# Patient Record
Sex: Female | Born: 1955 | Race: White | Marital: Married | Smoking: Former smoker
Health system: Southern US, Community
[De-identification: ages and names within clinical notes are randomized; demographics above are authoritative.]

## PROBLEM LIST (undated history)

## (undated) DIAGNOSIS — E559 Vitamin D deficiency, unspecified: Secondary | ICD-10-CM

## (undated) DIAGNOSIS — I10 Essential (primary) hypertension: Secondary | ICD-10-CM

## (undated) DIAGNOSIS — M1990 Unspecified osteoarthritis, unspecified site: Secondary | ICD-10-CM

## (undated) DIAGNOSIS — E785 Hyperlipidemia, unspecified: Secondary | ICD-10-CM

## (undated) DIAGNOSIS — K219 Gastro-esophageal reflux disease without esophagitis: Secondary | ICD-10-CM

## (undated) DIAGNOSIS — E039 Hypothyroidism, unspecified: Secondary | ICD-10-CM

## (undated) HISTORY — PX: HERNIA REPAIR: SHX51

## (undated) HISTORY — DX: Unspecified osteoarthritis, unspecified site: M19.90

## (undated) HISTORY — PX: CERVICAL FUSION: SHX112

## (undated) HISTORY — DX: Hyperlipidemia, unspecified: E78.5

## (undated) HISTORY — PX: HEEL SPUR SURGERY: SHX665

## (undated) HISTORY — DX: Vitamin D deficiency, unspecified: E55.9

## (undated) HISTORY — PX: CHOLECYSTECTOMY: SHX55

## (undated) HISTORY — PX: ABDOMINAL HYSTERECTOMY: SHX81

## (undated) HISTORY — PX: TONSILLECTOMY: SUR1361

---

## 2010-10-31 ENCOUNTER — Other Ambulatory Visit: Payer: Self-pay | Admitting: Neurosurgery

## 2010-10-31 DIAGNOSIS — M4712 Other spondylosis with myelopathy, cervical region: Secondary | ICD-10-CM

## 2010-11-01 ENCOUNTER — Ambulatory Visit
Admission: RE | Admit: 2010-11-01 | Discharge: 2010-11-01 | Disposition: A | Discharge status: Home / Self Care | Payer: BC Managed Care – PPO | Source: Ambulatory Visit | Attending: Neurosurgery | Admitting: Neurosurgery

## 2010-11-01 DIAGNOSIS — M4712 Other spondylosis with myelopathy, cervical region: Secondary | ICD-10-CM

## 2022-09-26 DIAGNOSIS — H04123 Dry eye syndrome of bilateral lacrimal glands: Secondary | ICD-10-CM | POA: Diagnosis not present

## 2022-09-26 DIAGNOSIS — H2513 Age-related nuclear cataract, bilateral: Secondary | ICD-10-CM | POA: Diagnosis not present

## 2022-10-24 NOTE — H&P (Signed)
Surgical History & Physical  Patient Name: Diane Phillips  DOB: [birth year]  Surgery: Cataract extraction with intraocular lens implant phacoemulsification; Right Eye Surgeon: Ronn Melena MD Surgery Date: 11/03/2022 Pre-Op Date: 09/26/2022  HPI: A 72 Yr. old female patient presents for a cataract evaluation. Patient reports that she has difficulty with small print making everyday tasks more difficult. Patient states that her vision is blurred and hazy. Patient states that she is unable to recognize people from [place]. Patient states that she has very poor night vision and is bothered by the glare she has from headlights. Patient states that she also receives glare from the sunlight making it more difficult to see street and traffic signs.   Medical History: Heart Problem  Review of Systems Negative Allergic/Immunologic Negative Constitutional Negative Ear, Nose, Mouth & Throat Negative Endocrine Negative Eyes Negative Gastrointestinal Negative Genitourinary Negative Hematologic/Lymphatic Negative Integumentary Negative Musculoskeletal Negative Neurological Negative Psychiatry Negative Respiratory Cardiovascular High Blood Pressure All recorded systems are negative except as noted above.  Social Never smoked   Medication Systane Complete,  SIMVASTATIN, LEVOTHYROXINE SODIUM, METOPROLOL TARTRATE, TRIAMTERENE/HYDROCHLOROTHIAZIDE, SOLIFENACIN SUCCINATE, TRAZODONE HYDROCHLORIDE, PANTOPRAZOLE SODIUM, BENZONATATE, LORATADINE, NAPROXEN, NYSTATIN  Sx/Procedures None  Drug Allergies NKDA  History & Physical: Heent: cataracts NECK: supple without bruits LUNGS: lungs clear to auscultation CV: regular rate and rhythm Abdomen: soft and non-tender  Impression & Plan: Assessment: 1.  Myopia ; Both Eyes (H52.13) 2.  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  CATARACT AGE-RELATED NUCLEAR; Both Eyes (H25.13)  Plan: 1.  continue with current for now  2.  Discussed ATs QID, and  warm compresses PRN  3.  Cataracts are visually significant and account for the patient's complaints. Discussed all risks, benefits, procedures and recovery, including infection, loss of vision and eye, need for glasses after surgery or additional procedures. Patient understands changing glasses will not improve vision. Patient indicated understanding of procedure. All questions answered. Patient desires to have surgery, recommend phacoemulsification with intraocular lens. Patient to have preliminary testing necessary (Argos/IOL Master, Mac OCT, TOPO) Educational materials provided:Cataract. RECOMMEND STANDARD VS EYEHANCE VS TORIC VS VIVTY Plan: - Proceed with surgery OD and then OS - RayOne Lens with -0.25 target - ok with lying flat, no fuchs, no DM - Good dilation - Omidria and combo drop

## 2022-10-27 ENCOUNTER — Encounter (HOSPITAL_COMMUNITY): Payer: Self-pay

## 2022-10-27 ENCOUNTER — Encounter (HOSPITAL_COMMUNITY)
Admission: RE | Admit: 2022-10-27 | Discharge: 2022-10-27 | Disposition: A | Discharge status: Home / Self Care | Payer: Self-pay | Source: Ambulatory Visit | Attending: Optometry | Admitting: Optometry

## 2022-10-27 ENCOUNTER — Other Ambulatory Visit: Payer: Self-pay

## 2022-10-27 DIAGNOSIS — H2511 Age-related nuclear cataract, right eye: Secondary | ICD-10-CM | POA: Diagnosis not present

## 2022-10-27 HISTORY — DX: Essential (primary) hypertension: I10

## 2022-10-27 HISTORY — DX: Hypothyroidism, unspecified: E03.9

## 2022-10-27 HISTORY — DX: Gastro-esophageal reflux disease without esophagitis: K21.9

## 2022-11-03 ENCOUNTER — Ambulatory Visit (HOSPITAL_COMMUNITY): Payer: Medicare Other | Admitting: Anesthesiology

## 2022-11-03 ENCOUNTER — Encounter (HOSPITAL_COMMUNITY)
Admission: RE | Disposition: A | Discharge status: Home / Self Care | Payer: Self-pay | Source: Home / Self Care | Attending: Optometry

## 2022-11-03 ENCOUNTER — Encounter (HOSPITAL_COMMUNITY): Payer: Self-pay | Admitting: Optometry

## 2022-11-03 ENCOUNTER — Ambulatory Visit (HOSPITAL_BASED_OUTPATIENT_CLINIC_OR_DEPARTMENT_OTHER): Payer: Medicare Other | Admitting: Anesthesiology

## 2022-11-03 ENCOUNTER — Ambulatory Visit (HOSPITAL_COMMUNITY)
Admission: RE | Admit: 2022-11-03 | Discharge: 2022-11-03 | Disposition: A | Discharge status: Home / Self Care | Payer: Medicare Other | Attending: Optometry | Admitting: Optometry

## 2022-11-03 DIAGNOSIS — Z6841 Body Mass Index (BMI) 40.0 and over, adult: Secondary | ICD-10-CM | POA: Insufficient documentation

## 2022-11-03 DIAGNOSIS — H2511 Age-related nuclear cataract, right eye: Secondary | ICD-10-CM | POA: Insufficient documentation

## 2022-11-03 DIAGNOSIS — E039 Hypothyroidism, unspecified: Secondary | ICD-10-CM

## 2022-11-03 DIAGNOSIS — K219 Gastro-esophageal reflux disease without esophagitis: Secondary | ICD-10-CM | POA: Diagnosis not present

## 2022-11-03 DIAGNOSIS — H25811 Combined forms of age-related cataract, right eye: Secondary | ICD-10-CM

## 2022-11-03 DIAGNOSIS — Z87891 Personal history of nicotine dependence: Secondary | ICD-10-CM | POA: Diagnosis not present

## 2022-11-03 DIAGNOSIS — I10 Essential (primary) hypertension: Secondary | ICD-10-CM | POA: Insufficient documentation

## 2022-11-03 DIAGNOSIS — H5213 Myopia, bilateral: Secondary | ICD-10-CM | POA: Diagnosis not present

## 2022-11-03 DIAGNOSIS — H04123 Dry eye syndrome of bilateral lacrimal glands: Secondary | ICD-10-CM | POA: Insufficient documentation

## 2022-11-03 HISTORY — PX: CATARACT EXTRACTION W/PHACO: SHX586

## 2022-11-03 SURGERY — PHACOEMULSIFICATION, CATARACT, WITH IOL INSERTION
Anesthesia: Monitor Anesthesia Care | Site: Eye | Laterality: Right

## 2022-11-03 MED ORDER — TROPICAMIDE 1 % OP SOLN
1.0000 [drp] | OPHTHALMIC | Status: AC
  Administered 2022-11-03 (×3): 1 [drp] via OPHTHALMIC

## 2022-11-03 MED ORDER — PHENYLEPHRINE-KETOROLAC 1-0.3 % IO SOLN
INTRAOCULAR | Status: DC | PRN
  Administered 2022-11-03: 500 mL via OPHTHALMIC

## 2022-11-03 MED ORDER — POVIDONE-IODINE 5 % OP SOLN
OPHTHALMIC | Status: DC | PRN
  Administered 2022-11-03: 1 via OPHTHALMIC

## 2022-11-03 MED ORDER — MIDAZOLAM HCL 5 MG/5ML IJ SOLN
INTRAMUSCULAR | Status: DC | PRN
  Administered 2022-11-03: 1 mg via INTRAVENOUS

## 2022-11-03 MED ORDER — TETRACAINE HCL 0.5 % OP SOLN
1.0000 [drp] | OPHTHALMIC | Status: AC
  Administered 2022-11-03 (×3): 1 [drp] via OPHTHALMIC

## 2022-11-03 MED ORDER — NEOMYCIN-POLYMYXIN-DEXAMETH 3.5-10000-0.1 OP SUSP
OPHTHALMIC | Status: DC | PRN
  Administered 2022-11-03: 1 [drp] via OPHTHALMIC

## 2022-11-03 MED ORDER — MIDAZOLAM HCL 2 MG/2ML IJ SOLN
INTRAMUSCULAR | Status: AC
  Filled 2022-11-03: qty 2

## 2022-11-03 MED ORDER — STERILE WATER FOR IRRIGATION IR SOLN
Status: DC | PRN
  Administered 2022-11-03: 250 mL

## 2022-11-03 MED ORDER — LIDOCAINE HCL (PF) 1 % IJ SOLN
INTRAMUSCULAR | Status: DC | PRN
  Administered 2022-11-03: 1 mL

## 2022-11-03 MED ORDER — FENTANYL CITRATE (PF) 100 MCG/2ML IJ SOLN
INTRAMUSCULAR | Status: DC | PRN
  Administered 2022-11-03: 50 ug via INTRAVENOUS

## 2022-11-03 MED ORDER — BSS IO SOLN
INTRAOCULAR | Status: DC | PRN
  Administered 2022-11-03: 15 mL via INTRAOCULAR

## 2022-11-03 MED ORDER — PHENYLEPHRINE HCL 2.5 % OP SOLN
1.0000 [drp] | OPHTHALMIC | Status: AC
  Administered 2022-11-03 (×3): 1 [drp] via OPHTHALMIC

## 2022-11-03 MED ORDER — SODIUM CHLORIDE 0.9% FLUSH
INTRAVENOUS | Status: DC | PRN
  Administered 2022-11-03: 10 mL via INTRAVENOUS

## 2022-11-03 MED ORDER — FENTANYL CITRATE (PF) 100 MCG/2ML IJ SOLN
INTRAMUSCULAR | Status: AC
  Filled 2022-11-03: qty 2

## 2022-11-03 MED ORDER — SIGHTPATH DOSE#1 NA HYALUR & NA CHOND-NA HYALUR IO KIT
PACK | INTRAOCULAR | Status: DC | PRN
  Administered 2022-11-03: 1 via OPHTHALMIC

## 2022-11-03 MED ORDER — LIDOCAINE HCL 3.5 % OP GEL
1.0000 | Freq: Once | OPHTHALMIC | Status: AC
  Administered 2022-11-03: 1 via OPHTHALMIC

## 2022-11-03 SURGICAL SUPPLY — 16 items
CATARACT SUITE SIGHTPATH (MISCELLANEOUS) ×1 IMPLANT
CLOTH BEACON ORANGE TIMEOUT ST (SAFETY) ×1 IMPLANT
DRSG TEGADERM 4X4.75 (GAUZE/BANDAGES/DRESSINGS) ×1 IMPLANT
EYE SHIELD UNIVERSAL CLEAR (GAUZE/BANDAGES/DRESSINGS) IMPLANT
FEE CATARACT SUITE SIGHTPATH (MISCELLANEOUS) ×1 IMPLANT
GLOVE BIOGEL PI IND STRL 7.0 (GLOVE) ×2 IMPLANT
LENS IOL RAYNER 17.5 (Intraocular Lens) ×1 IMPLANT
LENS IOL RAYONE EMV 17.5 (Intraocular Lens) IMPLANT
NDL HYPO 18GX1.5 BLUNT FILL (NEEDLE) ×1 IMPLANT
NEEDLE HYPO 18GX1.5 BLUNT FILL (NEEDLE) ×1 IMPLANT
PAD ARMBOARD 7.5X6 YLW CONV (MISCELLANEOUS) ×1 IMPLANT
RING MALYGIN 7.0 (MISCELLANEOUS) IMPLANT
SYR TB 1ML LL NO SAFETY (SYRINGE) ×1 IMPLANT
TAPE SURG TRANSPORE 1 IN (GAUZE/BANDAGES/DRESSINGS) IMPLANT
TAPE SURGICAL TRANSPORE 1 IN (GAUZE/BANDAGES/DRESSINGS) ×1
WATER STERILE IRR 250ML POUR (IV SOLUTION) ×1 IMPLANT

## 2022-11-03 NOTE — Interval H&P Note (Signed)
History and Physical Interval Note:  11/03/2022 8:53 AM  The H and P was reviewed and updated. The patient was examined.  No changes were found after exam.  The surgical eye was marked.  Diane Phillips

## 2022-11-03 NOTE — Anesthesia Postprocedure Evaluation (Signed)
Anesthesia Post Note  Patient: Diane Phillips  Procedure(s) Performed: CATARACT EXTRACTION PHACO AND INTRAOCULAR LENS PLACEMENT (IOC) (Right: Eye)  Patient location during evaluation: Phase II Anesthesia Type: MAC Level of consciousness: awake and alert and oriented Pain management: pain level controlled Vital Signs Assessment: post-procedure vital signs reviewed and stable Respiratory status: spontaneous breathing, nonlabored ventilation and respiratory function stable Cardiovascular status: stable and blood pressure returned to baseline Postop Assessment: no apparent nausea or vomiting Anesthetic complications: no  No notable events documented.   Last Vitals:  Vitals:   11/03/22 0815 11/03/22 0935  BP: (!) 163/89 (!) 156/86  Pulse: (!) 52 (!) 59  Resp: 14 14  Temp: 36.7 C 36.7 C  SpO2: 96% 96%    Last Pain:  Vitals:   11/03/22 0935  TempSrc: Oral  PainSc: 0-No pain                 Latavious Bitter C Rin Gorton

## 2022-11-03 NOTE — Op Note (Addendum)
Date of procedure: 11/03/22  Pre-operative diagnosis: Visually significant age-related nuclear cataract, Right Eye (H25.11)  Post-operative diagnosis: Visually significant age-related nuclear cataract, Right Eye  Procedure: Removal of cataract via phacoemulsification and insertion of intra-ocular lens Rayner [id number] +17.5D into the capsular bag of the Right Eye  Attending surgeon: Ronn Melena, MD  Anesthesia: MAC, Topical Akten  Complications: None  Estimated Blood Loss: <38m (minimal)  Specimens: None  Implants:  Implant Name Type Inv. Item Serial No. Manufacturer Lot No. LRB No. Used Action  LENS IOL RAYNER 17.5 - S18 Intraocular Lens LENS IOL RAYNER 17.5 18 SIGHTPATH [id number] Implanted    Indications:  Visually significant age-related cataract, Right Eye  Procedure:  The patient was seen and identified in the pre-operative area. The operative eye was identified and dilated.  The operative eye was marked.  Topical anesthesia was administered to the operative eye.     The patient was then to the operative suite and placed in the supine position.  A timeout was performed confirming the patient, procedure to be performed, and all other relevant information.   The patient's face was prepped and draped in the usual fashion for intra-ocular surgery.  A lid speculum was placed into the operative eye and the surgical microscope moved into place and focused.  A superotemporal paracentesis was created using a 20 gauge paracentesis blade.  BSS mixed with Omidria, followed by 1% lidocaine was injected into the anterior chamber.  Viscoelastic was injected into the anterior chamber.  A temporal clear-corneal main wound incision was created using a 2.458mmicrokeratome.  A continuous curvilinear capsulorrhexis was initiated using an irrigating cystitome and completed using capsulorrhexis forceps.  Hydrodissection and hydrodeliniation were performed.  Viscoelastic was injected into the  anterior chamber.  A phacoemulsification handpiece and a chopper as a second instrument were used to remove the nucleus and epinucleus. The irrigation/aspiration handpiece was used to remove any remaining cortical material.   The capsular bag was reinflated with viscoelastic, checked, and found to be intact.  The intraocular lens was inserted into the capsular bag.  The irrigation/aspiration handpiece was used to remove any remaining viscoelastic.  The clear corneal wound and paracentesis wounds were then hydrated and checked with Weck-Cels to be watertight.  The lid-speculum and drape was removed, and the patient's face was cleaned with a wet and dry 4x4.  One drop of maxitrol was placed on the ocular surface.  A clear shield was taped over the eye. The patient was taken to the post-operative care unit in good condition, having tolerated the procedure well.  Post-Op Instructions: [facility] and will receive all other orders and instructions.

## 2022-11-03 NOTE — Discharge Instructions (Signed)
Please discharge patient when stable, will follow up today with Dr. Masen Salvas at the [facility] [place] office immediately following discharge.  Leave shield in place until visit.  All paperwork with discharge instructions will be given at the office.  [facility] Address:  [address]  [place], [place] [address]  Dr. Odessa Morren's Phone: [phone]  

## 2022-11-03 NOTE — Anesthesia Preprocedure Evaluation (Signed)
Anesthesia Evaluation  Patient identified by MRN, date of birth, ID band Patient awake    Reviewed: Allergy & Precautions, H&P , NPO status , Patient's Chart, lab work & pertinent test results, reviewed documented beta blocker date and time   Airway Mallampati: III  TM Distance: >3 FB Neck ROM: Full    Dental  (+) Dental Advisory Given, Upper Dentures, Partial Lower   Pulmonary shortness of breath and with exertion, former smoker   Pulmonary exam normal breath sounds clear to auscultation       Cardiovascular hypertension, Pt. on medications and Pt. on home beta blockers Normal cardiovascular exam Rhythm:Regular Rate:Normal     Neuro/Psych negative neurological ROS  negative psych ROS   GI/Hepatic Neg liver ROS,GERD  Medicated and Controlled,,  Endo/Other  Hypothyroidism  Morbid obesity  Renal/GU negative Renal ROS  negative genitourinary   Musculoskeletal negative musculoskeletal ROS (+)    Abdominal   Peds negative pediatric ROS (+)  Hematology negative hematology ROS (+)   Anesthesia Other Findings   Reproductive/Obstetrics negative OB ROS                             Anesthesia Physical Anesthesia Plan  ASA: 3  Anesthesia Plan: MAC   Post-op Pain Management: Minimal or no pain anticipated   Induction: Intravenous  PONV Risk Score and Plan: Treatment may vary due to age or medical condition  Airway Management Planned: Nasal Cannula and Natural Airway  Additional Equipment:   Intra-op Plan:   Post-operative Plan:   Informed Consent: I have reviewed the patients History and Physical, chart, labs and discussed the procedure including the risks, benefits and alternatives for the proposed anesthesia with the patient or authorized representative who has indicated his/her understanding and acceptance.     Dental advisory given  Plan Discussed with: CRNA and  Surgeon  Anesthesia Plan Comments:        Anesthesia Quick Evaluation

## 2022-11-03 NOTE — Anesthesia Procedure Notes (Signed)
Procedure Name: MAC Date/Time: 11/03/2022 9:21 AM  Performed by: Maude Leriche, CRNAPre-anesthesia Checklist: Patient identified, Emergency Drugs available, Suction available, Patient being monitored and Timeout performed Patient Re-evaluated:Patient Re-evaluated prior to induction Oxygen Delivery Method: Nasal cannula Placement Confirmation: positive ETCO2 Dental Injury: Teeth and Oropharynx as per pre-operative assessment

## 2022-11-03 NOTE — Transfer of Care (Signed)
Immediate Anesthesia Transfer of Care Note  Patient: Diane Phillips  Procedure(s) Performed: CATARACT EXTRACTION PHACO AND INTRAOCULAR LENS PLACEMENT (IOC) (Right: Eye)  Patient Location: PACU  Anesthesia Type:MAC  Level of Consciousness: awake, alert , and oriented  Airway & Oxygen Therapy: Patient Spontanous Breathing  Post-op Assessment: Report given to RN, Post -op Vital signs reviewed and stable, Patient moving all extremities X 4, and Patient able to stick tongue midline  Post vital signs: Reviewed  Last Vitals:  Vitals Value Taken Time  BP 156/68   Temp 97.6   Pulse 58   Resp 19   SpO2 96     Last Pain:  Vitals:   11/03/22 0815  TempSrc: Oral         Complications: No notable events documented.

## 2022-11-10 ENCOUNTER — Encounter (HOSPITAL_COMMUNITY): Payer: Self-pay | Admitting: Optometry

## 2022-11-21 NOTE — H&P (Signed)
Surgical History & Physical  Patient Name: Diane Phillips  DOB: 08-27-1956  Surgery: Cataract extraction with intraocular lens implant phacoemulsification; Left Eye Surgeon: Ronn Melena MD Surgery Date: 12/01/2022 Pre-Op Date: 09/26/2022  HPI: A 4 Yr. old female patient presents for a cataract evaluation. Patient reports that she has difficulty with small print making everyday tasks more difficult. Patient states that her vision is blurred and hazy. Patient states that she is unable to recognize people from [place]. Patient states that she has very poor night vision and is bothered by the glare she has from headlights. Patient states that she also receives glare from the sunlight making it more difficult to see street and traffic signs.   Medical History: Heart Problem  Cardiovascular High Blood Pressure All recorded systems are negative except as noted above.  Social Never smoked  Medication Systane Complete, Prednisolone-Moxifloxacin-Bromfenac,  SIMVASTATIN, LEVOTHYROXINE SODIUM, METOPROLOL TARTRATE, TRIAMTERENE/HYDROCHLOROTHIAZIDE, SOLIFENACIN SUCCINATE, TRAZODONE HYDROCHLORIDE, PANTOPRAZOLE SODIUM, BENZONATATE, LORATADINE, NAPROXEN, NYSTATIN  Sx/Procedures Phaco c IOL OD  No Known Drug Allergies   History & Physical: Heent: cataract OS NECK: supple without bruits LUNGS: lungs clear to auscultation CV: regular rate and rhythm Abdomen: soft and non-tender  Impression & Plan: Assessment: 1.  Myopia ; Both Eyes (H52.13) 2.  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  CATARACT AGE-RELATED NUCLEAR; Left Eye (H25.12)  Plan: 1.  continue with current for now  2.  Discussed ATs QID, and warm compresses PRN  3.  Cataracts are visually significant and account for the patient's complaints. Discussed all risks, benefits, procedures and recovery, including infection, loss of vision and eye, need for glasses after surgery or additional procedures. Patient understands changing  glasses will not improve vision. Patient indicated understanding of procedure. All questions answered. Patient desires to have surgery, recommend phacoemulsification with intraocular lens. Patient to have preliminary testing necessary (Argos/IOL Master, Mac OCT, TOPO) Educational materials provided:Cataract. RECOMMEND STANDARD VS EYEHANCE VS TORIC VS VIVTY Plan: - Proceed with surgery OS - RayOne Lens with -0.25 target - ok with lying flat, no fuchs, no DM - Good dilation - Omidria and combo drop

## 2022-11-24 DIAGNOSIS — H2512 Age-related nuclear cataract, left eye: Secondary | ICD-10-CM | POA: Diagnosis not present

## 2022-11-28 ENCOUNTER — Encounter (HOSPITAL_COMMUNITY)
Admission: RE | Admit: 2022-11-28 | Discharge: 2022-11-28 | Disposition: A | Discharge status: Home / Self Care | Payer: Medicare Other | Source: Ambulatory Visit | Attending: Optometry | Admitting: Optometry

## 2022-11-28 ENCOUNTER — Other Ambulatory Visit: Payer: Self-pay

## 2022-11-28 ENCOUNTER — Encounter (HOSPITAL_COMMUNITY): Payer: Self-pay

## 2022-11-28 NOTE — Pre-Procedure Instructions (Signed)
Attempted pre-op phonecall. Left VM for her to call us back. 

## 2022-11-30 DIAGNOSIS — F17210 Nicotine dependence, cigarettes, uncomplicated: Secondary | ICD-10-CM | POA: Diagnosis not present

## 2022-11-30 DIAGNOSIS — R600 Localized edema: Secondary | ICD-10-CM | POA: Diagnosis not present

## 2022-11-30 DIAGNOSIS — E039 Hypothyroidism, unspecified: Secondary | ICD-10-CM | POA: Diagnosis not present

## 2022-11-30 DIAGNOSIS — M5442 Lumbago with sciatica, left side: Secondary | ICD-10-CM | POA: Diagnosis not present

## 2022-11-30 DIAGNOSIS — M5441 Lumbago with sciatica, right side: Secondary | ICD-10-CM | POA: Diagnosis not present

## 2022-11-30 DIAGNOSIS — E782 Mixed hyperlipidemia: Secondary | ICD-10-CM | POA: Diagnosis not present

## 2022-12-01 ENCOUNTER — Ambulatory Visit (HOSPITAL_COMMUNITY): Payer: Medicare Other | Admitting: Anesthesiology

## 2022-12-01 ENCOUNTER — Encounter (HOSPITAL_COMMUNITY): Payer: Self-pay | Admitting: Optometry

## 2022-12-01 ENCOUNTER — Ambulatory Visit (HOSPITAL_BASED_OUTPATIENT_CLINIC_OR_DEPARTMENT_OTHER): Payer: Medicare Other | Admitting: Anesthesiology

## 2022-12-01 ENCOUNTER — Ambulatory Visit (HOSPITAL_COMMUNITY)
Admission: RE | Admit: 2022-12-01 | Discharge: 2022-12-01 | Disposition: A | Discharge status: Home / Self Care | Payer: Medicare Other | Attending: Optometry | Admitting: Optometry

## 2022-12-01 ENCOUNTER — Encounter (HOSPITAL_COMMUNITY)
Admission: RE | Disposition: A | Discharge status: Home / Self Care | Payer: Self-pay | Source: Home / Self Care | Attending: Optometry

## 2022-12-01 DIAGNOSIS — E039 Hypothyroidism, unspecified: Secondary | ICD-10-CM | POA: Diagnosis not present

## 2022-12-01 DIAGNOSIS — Z87891 Personal history of nicotine dependence: Secondary | ICD-10-CM

## 2022-12-01 DIAGNOSIS — I10 Essential (primary) hypertension: Secondary | ICD-10-CM | POA: Diagnosis not present

## 2022-12-01 DIAGNOSIS — H2512 Age-related nuclear cataract, left eye: Secondary | ICD-10-CM | POA: Insufficient documentation

## 2022-12-01 DIAGNOSIS — Z6841 Body Mass Index (BMI) 40.0 and over, adult: Secondary | ICD-10-CM | POA: Diagnosis not present

## 2022-12-01 DIAGNOSIS — K219 Gastro-esophageal reflux disease without esophagitis: Secondary | ICD-10-CM | POA: Diagnosis not present

## 2022-12-01 DIAGNOSIS — H25812 Combined forms of age-related cataract, left eye: Secondary | ICD-10-CM

## 2022-12-01 DIAGNOSIS — Z79899 Other long term (current) drug therapy: Secondary | ICD-10-CM | POA: Diagnosis not present

## 2022-12-01 HISTORY — PX: CATARACT EXTRACTION W/PHACO: SHX586

## 2022-12-01 SURGERY — PHACOEMULSIFICATION, CATARACT, WITH IOL INSERTION
Anesthesia: Monitor Anesthesia Care | Site: Eye | Laterality: Left

## 2022-12-01 MED ORDER — MIDAZOLAM HCL 2 MG/2ML IJ SOLN
INTRAMUSCULAR | Status: AC
  Filled 2022-12-01: qty 2

## 2022-12-01 MED ORDER — STERILE WATER FOR IRRIGATION IR SOLN
Status: DC | PRN
  Administered 2022-12-01: 25 mL

## 2022-12-01 MED ORDER — PHENYLEPHRINE-KETOROLAC 1-0.3 % IO SOLN
INTRAOCULAR | Status: DC | PRN
  Administered 2022-12-01: 500 mL via OPHTHALMIC

## 2022-12-01 MED ORDER — POVIDONE-IODINE 5 % OP SOLN
OPHTHALMIC | Status: DC | PRN
  Administered 2022-12-01: 1 via OPHTHALMIC

## 2022-12-01 MED ORDER — PHENYLEPHRINE-KETOROLAC 1-0.3 % IO SOLN
INTRAOCULAR | Status: AC
  Filled 2022-12-01: qty 4

## 2022-12-01 MED ORDER — LIDOCAINE HCL (PF) 1 % IJ SOLN
INTRAMUSCULAR | Status: DC | PRN
  Administered 2022-12-01: 1 mL

## 2022-12-01 MED ORDER — TROPICAMIDE 1 % OP SOLN
1.0000 [drp] | OPHTHALMIC | Status: AC | PRN
  Administered 2022-12-01 (×3): 1 [drp] via OPHTHALMIC

## 2022-12-01 MED ORDER — BSS IO SOLN
INTRAOCULAR | Status: DC | PRN
  Administered 2022-12-01: 15 mL via INTRAOCULAR

## 2022-12-01 MED ORDER — NEOMYCIN-POLYMYXIN-DEXAMETH 3.5-10000-0.1 OP SUSP
OPHTHALMIC | Status: DC | PRN
  Administered 2022-12-01: 2 [drp] via OPHTHALMIC

## 2022-12-01 MED ORDER — FENTANYL CITRATE (PF) 100 MCG/2ML IJ SOLN
INTRAMUSCULAR | Status: DC | PRN
  Administered 2022-12-01: 50 ug via INTRAVENOUS

## 2022-12-01 MED ORDER — TETRACAINE HCL 0.5 % OP SOLN
1.0000 [drp] | OPHTHALMIC | Status: AC | PRN
  Administered 2022-12-01 (×3): 1 [drp] via OPHTHALMIC

## 2022-12-01 MED ORDER — PHENYLEPHRINE HCL 2.5 % OP SOLN
1.0000 [drp] | OPHTHALMIC | Status: AC | PRN
  Administered 2022-12-01 (×3): 1 [drp] via OPHTHALMIC

## 2022-12-01 MED ORDER — LIDOCAINE HCL 3.5 % OP GEL
1.0000 | Freq: Once | OPHTHALMIC | Status: AC
  Administered 2022-12-01: 1 via OPHTHALMIC

## 2022-12-01 MED ORDER — FENTANYL CITRATE (PF) 100 MCG/2ML IJ SOLN
INTRAMUSCULAR | Status: AC
  Filled 2022-12-01: qty 2

## 2022-12-01 MED ORDER — MIDAZOLAM HCL 2 MG/2ML IJ SOLN
INTRAMUSCULAR | Status: DC | PRN
  Administered 2022-12-01: 1 mg via INTRAVENOUS

## 2022-12-01 MED ORDER — SIGHTPATH DOSE#1 NA HYALUR & NA CHOND-NA HYALUR IO KIT
PACK | INTRAOCULAR | Status: DC | PRN
  Administered 2022-12-01: 1 via OPHTHALMIC

## 2022-12-01 SURGICAL SUPPLY — 14 items
CATARACT SUITE SIGHTPATH (MISCELLANEOUS) ×1 IMPLANT
CLOTH BEACON ORANGE TIMEOUT ST (SAFETY) ×1 IMPLANT
DRSG TEGADERM 4X4.75 (GAUZE/BANDAGES/DRESSINGS) ×1 IMPLANT
EYE SHIELD UNIVERSAL CLEAR (GAUZE/BANDAGES/DRESSINGS) IMPLANT
FEE CATARACT SUITE SIGHTPATH (MISCELLANEOUS) ×1 IMPLANT
GLOVE BIOGEL PI IND STRL 7.0 (GLOVE) ×2 IMPLANT
LENS IOL RAYNER 18.0 (Intraocular Lens) ×1 IMPLANT
LENS IOL RAYONE EMV 18.0 (Intraocular Lens) IMPLANT
NDL HYPO 18GX1.5 BLUNT FILL (NEEDLE) ×1 IMPLANT
NEEDLE HYPO 18GX1.5 BLUNT FILL (NEEDLE) ×1 IMPLANT
PAD ARMBOARD 7.5X6 YLW CONV (MISCELLANEOUS) ×1 IMPLANT
SYR TB 1ML LL NO SAFETY (SYRINGE) ×1 IMPLANT
TAPE SURG TRANSPORE 1 IN (GAUZE/BANDAGES/DRESSINGS) IMPLANT
WATER STERILE IRR 250ML POUR (IV SOLUTION) ×1 IMPLANT

## 2022-12-01 NOTE — Interval H&P Note (Signed)
History and Physical Interval Note:  12/01/2022 12:56 PM  The H and P was reviewed and updated. The patient was examined.  No changes were found after exam.  The surgical eye was marked.  Diane Phillips

## 2022-12-01 NOTE — Anesthesia Preprocedure Evaluation (Signed)
Anesthesia Evaluation  Patient identified by MRN, date of birth, ID band Patient awake    Reviewed: Allergy & Precautions, H&P , NPO status , Patient's Chart, lab work & pertinent test results, reviewed documented beta blocker date and time   Airway Mallampati: III  TM Distance: >3 FB Neck ROM: Full    Dental  (+) Dental Advisory Given, Upper Dentures, Partial Lower   Pulmonary shortness of breath and with exertion, former smoker   Pulmonary exam normal breath sounds clear to auscultation       Cardiovascular hypertension, Pt. on medications and Pt. on home beta blockers Normal cardiovascular exam Rhythm:Regular Rate:Normal     Neuro/Psych negative neurological ROS  negative psych ROS   GI/Hepatic Neg liver ROS,GERD  Medicated and Controlled,,  Endo/Other  Hypothyroidism  Morbid obesity  Renal/GU negative Renal ROS  negative genitourinary   Musculoskeletal negative musculoskeletal ROS (+)    Abdominal   Peds negative pediatric ROS (+)  Hematology negative hematology ROS (+)   Anesthesia Other Findings   Reproductive/Obstetrics negative OB ROS                             Anesthesia Physical Anesthesia Plan  ASA: 3  Anesthesia Plan: MAC   Post-op Pain Management: Minimal or no pain anticipated   Induction: Intravenous  PONV Risk Score and Plan: Treatment may vary due to age or medical condition  Airway Management Planned: Nasal Cannula and Natural Airway  Additional Equipment:   Intra-op Plan:   Post-operative Plan:   Informed Consent: I have reviewed the patients History and Physical, chart, labs and discussed the procedure including the risks, benefits and alternatives for the proposed anesthesia with the patient or authorized representative who has indicated his/her understanding and acceptance.     Dental advisory given  Plan Discussed with: CRNA and  Surgeon  Anesthesia Plan Comments:        Anesthesia Quick Evaluation  

## 2022-12-01 NOTE — Op Note (Signed)
Date of procedure: 12/01/22  Pre-operative diagnosis: Visually significant age-related nuclear cataract, Left Eye (H25.12)  Post-operative diagnosis: Visually significant age-related nuclear cataract, Left Eye  Procedure: Removal of cataract via phacoemulsification and insertion of intra-ocular lens Rayner [id number] +18.0D into the capsular bag of the Left Eye  Attending surgeon: Bryon Lions, MD  Anesthesia: MAC, Topical Akten  Complications: None  Estimated Blood Loss: <45mL (minimal)  Specimens: None  Implants:  Implant Name Type Inv. Item Serial No. Manufacturer Lot No. LRB No. Used Action  LENS IOL RAYNER 18.0 - S56 Intraocular Lens LENS IOL RAYNER 18.0 56 SIGHTPATH [id number] 1 Implanted    Indications:  Visually significant age-related cataract, Left Eye  Procedure:  The patient was seen and identified in the pre-operative area. The operative eye was identified and dilated.  The operative eye was marked.  Topical anesthesia was administered to the operative eye.     The patient was then to the operative suite and placed in the supine position.  A timeout was performed confirming the patient, procedure to be performed, and all other relevant information.   The patient's face was prepped and draped in the usual fashion for intra-ocular surgery.  A lid speculum was placed into the operative eye and the surgical microscope moved into place and focused.  An inferotemporal paracentesis was created using a 20 gauge paracentesis blade.  BSS mixed with Omidria, followed by 1% lidocaine was injected into the anterior chamber.  Viscoelastic was injected into the anterior chamber.  A temporal clear-corneal main wound incision was created using a 2.45mm microkeratome.  A continuous curvilinear capsulorrhexis was initiated using an irrigating cystitome and completed using capsulorrhexis forceps.  Hydrodissection and hydrodeliniation were performed.  Viscoelastic was injected into the  anterior chamber.  A phacoemulsification handpiece and a chopper as a second instrument were used to remove the nucleus and epinucleus. The irrigation/aspiration handpiece was used to remove any remaining cortical material.   The capsular bag was reinflated with viscoelastic, checked, and found to be intact.  The intraocular lens was inserted into the capsular bag.  The irrigation/aspiration handpiece was used to remove any remaining viscoelastic.  The clear corneal wound and paracentesis wounds were then hydrated and checked with Weck-Cels to be watertight.  The lid-speculum and drape was removed, and the patient's face was cleaned with a wet and dry 4x4.  Moxifloxacin was instilled onto the eye. A clear shield was taped over the eye. The patient was taken to the post-operative care unit in good condition, having tolerated the procedure well.  Post-Op Instructions: The patient will follow up at [facility] for a same day post-operative evaluation and will receive all other orders and instructions.

## 2022-12-01 NOTE — Anesthesia Postprocedure Evaluation (Signed)
Anesthesia Post Note  Patient: Diane Phillips  Procedure(s) Performed: CATARACT EXTRACTION PHACO AND INTRAOCULAR LENS PLACEMENT (IOC) (Left: Eye)  Patient location during evaluation: PACU Anesthesia Type: MAC Level of consciousness: awake and alert and oriented Pain management: pain level controlled Vital Signs Assessment: post-procedure vital signs reviewed and stable Respiratory status: spontaneous breathing, nonlabored ventilation and respiratory function stable Cardiovascular status: blood pressure returned to baseline and stable Postop Assessment: no apparent nausea or vomiting Anesthetic complications: no  No notable events documented.   Last Vitals:  Vitals:   12/01/22 1251 12/01/22 1352  BP: (!) 155/73 133/77  Pulse:  (!) 52  Resp: 20 18  Temp: 36.7 C 36.6 C  SpO2: 100% 97%    Last Pain:  Vitals:   12/01/22 1352  TempSrc: Oral  PainSc: 0-No pain                 Caryn Gienger C Aarian Cleaver

## 2022-12-01 NOTE — Discharge Instructions (Signed)
Please discharge patient when stable, will follow up today with Dr. Bethel Sirois at the [facility] office immediately following discharge.  Leave shield in place until visit.  All paperwork with discharge instructions will be given at the office.  [facility] Address:  [address]  [place], [place] [address]  Dr. Atticus Lemberger's Phone: [phone]  

## 2022-12-01 NOTE — Transfer of Care (Signed)
Immediate Anesthesia Transfer of Care Note  Patient: Diane Phillips  Procedure(s) Performed: CATARACT EXTRACTION PHACO AND INTRAOCULAR LENS PLACEMENT (IOC) (Left: Eye)  Patient Location: Short Stay  Anesthesia Type:MAC  Level of Consciousness: awake, alert , and oriented  Airway & Oxygen Therapy: Patient Spontanous Breathing  Post-op Assessment: Report given to RN and Post -op Vital signs reviewed and stable  Post vital signs: Reviewed and stable  Last Vitals:  Vitals Value Taken Time  BP    Temp 36.6 C 12/01/22 1352  Pulse 52 12/01/22 1352  Resp 18 12/01/22 1352  SpO2 97 % 12/01/22 1352    Last Pain:  Vitals:   12/01/22 1352  TempSrc: Oral  PainSc: 0-No pain      Patients Stated Pain Goal: 7 [id number] [phone])  Complications: No notable events documented.

## 2022-12-13 ENCOUNTER — Encounter (HOSPITAL_COMMUNITY): Payer: Self-pay | Admitting: Optometry

## 2023-01-04 DIAGNOSIS — M25561 Pain in right knee: Secondary | ICD-10-CM | POA: Diagnosis not present

## 2023-01-04 DIAGNOSIS — M5442 Lumbago with sciatica, left side: Secondary | ICD-10-CM | POA: Diagnosis not present

## 2023-01-04 DIAGNOSIS — R030 Elevated blood-pressure reading, without diagnosis of hypertension: Secondary | ICD-10-CM | POA: Diagnosis not present

## 2023-01-04 DIAGNOSIS — F17210 Nicotine dependence, cigarettes, uncomplicated: Secondary | ICD-10-CM | POA: Diagnosis not present

## 2023-01-06 DIAGNOSIS — Z79899 Other long term (current) drug therapy: Secondary | ICD-10-CM | POA: Diagnosis not present

## 2023-01-06 DIAGNOSIS — E079 Disorder of thyroid, unspecified: Secondary | ICD-10-CM | POA: Diagnosis not present

## 2023-01-06 DIAGNOSIS — M1711 Unilateral primary osteoarthritis, right knee: Secondary | ICD-10-CM | POA: Diagnosis not present

## 2023-01-06 DIAGNOSIS — W108XXA Fall (on) (from) other stairs and steps, initial encounter: Secondary | ICD-10-CM | POA: Diagnosis not present

## 2023-01-06 DIAGNOSIS — M25461 Effusion, right knee: Secondary | ICD-10-CM | POA: Diagnosis not present

## 2023-01-06 DIAGNOSIS — M25561 Pain in right knee: Secondary | ICD-10-CM | POA: Diagnosis not present

## 2023-01-23 DIAGNOSIS — E039 Hypothyroidism, unspecified: Secondary | ICD-10-CM | POA: Diagnosis not present

## 2023-01-29 DIAGNOSIS — G8929 Other chronic pain: Secondary | ICD-10-CM | POA: Diagnosis not present

## 2023-01-29 DIAGNOSIS — M25562 Pain in left knee: Secondary | ICD-10-CM | POA: Diagnosis not present

## 2023-01-29 DIAGNOSIS — M25561 Pain in right knee: Secondary | ICD-10-CM | POA: Diagnosis not present

## 2023-01-31 DIAGNOSIS — E559 Vitamin D deficiency, unspecified: Secondary | ICD-10-CM | POA: Diagnosis not present

## 2023-01-31 DIAGNOSIS — E119 Type 2 diabetes mellitus without complications: Secondary | ICD-10-CM | POA: Diagnosis not present

## 2023-01-31 DIAGNOSIS — F17210 Nicotine dependence, cigarettes, uncomplicated: Secondary | ICD-10-CM | POA: Diagnosis not present

## 2023-01-31 DIAGNOSIS — K219 Gastro-esophageal reflux disease without esophagitis: Secondary | ICD-10-CM | POA: Diagnosis not present

## 2023-01-31 DIAGNOSIS — E039 Hypothyroidism, unspecified: Secondary | ICD-10-CM | POA: Diagnosis not present

## 2023-01-31 DIAGNOSIS — E785 Hyperlipidemia, unspecified: Secondary | ICD-10-CM | POA: Diagnosis not present

## 2023-01-31 DIAGNOSIS — I10 Essential (primary) hypertension: Secondary | ICD-10-CM | POA: Diagnosis not present

## 2023-02-13 DIAGNOSIS — M25562 Pain in left knee: Secondary | ICD-10-CM | POA: Diagnosis not present

## 2023-02-21 DIAGNOSIS — H26493 Other secondary cataract, bilateral: Secondary | ICD-10-CM | POA: Diagnosis not present

## 2023-02-28 DIAGNOSIS — M25562 Pain in left knee: Secondary | ICD-10-CM | POA: Diagnosis not present

## 2023-03-12 DIAGNOSIS — M25562 Pain in left knee: Secondary | ICD-10-CM | POA: Diagnosis not present

## 2023-03-12 DIAGNOSIS — G8929 Other chronic pain: Secondary | ICD-10-CM | POA: Diagnosis not present

## 2023-03-12 DIAGNOSIS — M25561 Pain in right knee: Secondary | ICD-10-CM | POA: Diagnosis not present

## 2023-03-13 DIAGNOSIS — M25562 Pain in left knee: Secondary | ICD-10-CM | POA: Diagnosis not present

## 2023-03-20 DIAGNOSIS — E039 Hypothyroidism, unspecified: Secondary | ICD-10-CM | POA: Diagnosis not present

## 2023-03-27 DIAGNOSIS — M25562 Pain in left knee: Secondary | ICD-10-CM | POA: Diagnosis not present

## 2023-05-03 DIAGNOSIS — F17210 Nicotine dependence, cigarettes, uncomplicated: Secondary | ICD-10-CM | POA: Diagnosis not present

## 2023-05-03 DIAGNOSIS — M170 Bilateral primary osteoarthritis of knee: Secondary | ICD-10-CM | POA: Diagnosis not present

## 2023-05-15 DIAGNOSIS — R5383 Other fatigue: Secondary | ICD-10-CM | POA: Diagnosis not present

## 2023-05-15 DIAGNOSIS — R739 Hyperglycemia, unspecified: Secondary | ICD-10-CM | POA: Diagnosis not present

## 2023-05-15 DIAGNOSIS — I10 Essential (primary) hypertension: Secondary | ICD-10-CM | POA: Diagnosis not present

## 2023-05-15 DIAGNOSIS — E039 Hypothyroidism, unspecified: Secondary | ICD-10-CM | POA: Diagnosis not present

## 2023-05-15 DIAGNOSIS — F17210 Nicotine dependence, cigarettes, uncomplicated: Secondary | ICD-10-CM | POA: Diagnosis not present

## 2023-05-15 DIAGNOSIS — K219 Gastro-esophageal reflux disease without esophagitis: Secondary | ICD-10-CM | POA: Diagnosis not present

## 2023-05-15 DIAGNOSIS — E785 Hyperlipidemia, unspecified: Secondary | ICD-10-CM | POA: Diagnosis not present

## 2023-05-15 DIAGNOSIS — M170 Bilateral primary osteoarthritis of knee: Secondary | ICD-10-CM | POA: Diagnosis not present

## 2023-05-22 DIAGNOSIS — A084 Viral intestinal infection, unspecified: Secondary | ICD-10-CM | POA: Diagnosis not present

## 2023-06-04 DIAGNOSIS — K219 Gastro-esophageal reflux disease without esophagitis: Secondary | ICD-10-CM | POA: Diagnosis not present

## 2023-06-04 DIAGNOSIS — Z0000 Encounter for general adult medical examination without abnormal findings: Secondary | ICD-10-CM | POA: Diagnosis not present

## 2023-06-04 DIAGNOSIS — M170 Bilateral primary osteoarthritis of knee: Secondary | ICD-10-CM | POA: Diagnosis not present

## 2023-06-04 DIAGNOSIS — Z23 Encounter for immunization: Secondary | ICD-10-CM | POA: Diagnosis not present

## 2023-06-04 DIAGNOSIS — R7303 Prediabetes: Secondary | ICD-10-CM | POA: Diagnosis not present

## 2023-06-11 DIAGNOSIS — M5442 Lumbago with sciatica, left side: Secondary | ICD-10-CM | POA: Diagnosis not present

## 2023-06-11 DIAGNOSIS — M170 Bilateral primary osteoarthritis of knee: Secondary | ICD-10-CM | POA: Diagnosis not present

## 2023-06-11 DIAGNOSIS — Z79891 Long term (current) use of opiate analgesic: Secondary | ICD-10-CM | POA: Diagnosis not present

## 2023-06-11 DIAGNOSIS — G894 Chronic pain syndrome: Secondary | ICD-10-CM | POA: Diagnosis not present

## 2023-06-26 DIAGNOSIS — R112 Nausea with vomiting, unspecified: Secondary | ICD-10-CM | POA: Diagnosis not present

## 2023-06-28 DIAGNOSIS — Z1231 Encounter for screening mammogram for malignant neoplasm of breast: Secondary | ICD-10-CM | POA: Diagnosis not present

## 2023-07-11 DIAGNOSIS — K625 Hemorrhage of anus and rectum: Secondary | ICD-10-CM | POA: Diagnosis not present

## 2023-08-27 DIAGNOSIS — I10 Essential (primary) hypertension: Secondary | ICD-10-CM | POA: Diagnosis not present

## 2023-08-27 DIAGNOSIS — E785 Hyperlipidemia, unspecified: Secondary | ICD-10-CM | POA: Diagnosis not present

## 2023-08-27 DIAGNOSIS — R739 Hyperglycemia, unspecified: Secondary | ICD-10-CM | POA: Diagnosis not present

## 2023-08-27 DIAGNOSIS — R7303 Prediabetes: Secondary | ICD-10-CM | POA: Diagnosis not present

## 2023-08-27 DIAGNOSIS — E559 Vitamin D deficiency, unspecified: Secondary | ICD-10-CM | POA: Diagnosis not present

## 2023-08-31 DIAGNOSIS — M170 Bilateral primary osteoarthritis of knee: Secondary | ICD-10-CM | POA: Diagnosis not present

## 2023-08-31 DIAGNOSIS — K219 Gastro-esophageal reflux disease without esophagitis: Secondary | ICD-10-CM | POA: Diagnosis not present

## 2023-08-31 DIAGNOSIS — F17210 Nicotine dependence, cigarettes, uncomplicated: Secondary | ICD-10-CM | POA: Diagnosis not present

## 2023-08-31 DIAGNOSIS — R7303 Prediabetes: Secondary | ICD-10-CM | POA: Diagnosis not present

## 2023-08-31 DIAGNOSIS — E785 Hyperlipidemia, unspecified: Secondary | ICD-10-CM | POA: Diagnosis not present

## 2023-08-31 DIAGNOSIS — Z1389 Encounter for screening for other disorder: Secondary | ICD-10-CM | POA: Diagnosis not present

## 2023-08-31 DIAGNOSIS — E039 Hypothyroidism, unspecified: Secondary | ICD-10-CM | POA: Diagnosis not present

## 2023-08-31 DIAGNOSIS — I10 Essential (primary) hypertension: Secondary | ICD-10-CM | POA: Diagnosis not present

## 2023-10-22 DIAGNOSIS — E785 Hyperlipidemia, unspecified: Secondary | ICD-10-CM | POA: Diagnosis not present

## 2023-10-22 DIAGNOSIS — E039 Hypothyroidism, unspecified: Secondary | ICD-10-CM | POA: Diagnosis not present

## 2023-10-22 DIAGNOSIS — R609 Edema, unspecified: Secondary | ICD-10-CM | POA: Diagnosis not present

## 2023-10-22 DIAGNOSIS — I10 Essential (primary) hypertension: Secondary | ICD-10-CM | POA: Diagnosis not present

## 2023-11-08 DIAGNOSIS — I10 Essential (primary) hypertension: Secondary | ICD-10-CM | POA: Diagnosis not present

## 2023-11-08 DIAGNOSIS — E039 Hypothyroidism, unspecified: Secondary | ICD-10-CM | POA: Diagnosis not present

## 2023-11-08 DIAGNOSIS — Z87891 Personal history of nicotine dependence: Secondary | ICD-10-CM | POA: Diagnosis not present

## 2023-11-08 DIAGNOSIS — K625 Hemorrhage of anus and rectum: Secondary | ICD-10-CM | POA: Diagnosis not present

## 2023-11-08 DIAGNOSIS — E7800 Pure hypercholesterolemia, unspecified: Secondary | ICD-10-CM | POA: Diagnosis not present

## 2023-11-08 DIAGNOSIS — E119 Type 2 diabetes mellitus without complications: Secondary | ICD-10-CM | POA: Diagnosis not present

## 2023-11-08 DIAGNOSIS — Z79899 Other long term (current) drug therapy: Secondary | ICD-10-CM | POA: Diagnosis not present

## 2023-11-08 DIAGNOSIS — K5730 Diverticulosis of large intestine without perforation or abscess without bleeding: Secondary | ICD-10-CM | POA: Diagnosis not present

## 2023-11-08 DIAGNOSIS — Z972 Presence of dental prosthetic device (complete) (partial): Secondary | ICD-10-CM | POA: Diagnosis not present

## 2023-11-08 DIAGNOSIS — K644 Residual hemorrhoidal skin tags: Secondary | ICD-10-CM | POA: Diagnosis not present

## 2023-11-08 DIAGNOSIS — K5790 Diverticulosis of intestine, part unspecified, without perforation or abscess without bleeding: Secondary | ICD-10-CM | POA: Diagnosis not present

## 2023-11-27 DIAGNOSIS — R739 Hyperglycemia, unspecified: Secondary | ICD-10-CM | POA: Diagnosis not present

## 2023-11-27 DIAGNOSIS — K219 Gastro-esophageal reflux disease without esophagitis: Secondary | ICD-10-CM | POA: Diagnosis not present

## 2023-11-27 DIAGNOSIS — E039 Hypothyroidism, unspecified: Secondary | ICD-10-CM | POA: Diagnosis not present

## 2023-11-27 DIAGNOSIS — Z1321 Encounter for screening for nutritional disorder: Secondary | ICD-10-CM | POA: Diagnosis not present

## 2023-11-27 DIAGNOSIS — E785 Hyperlipidemia, unspecified: Secondary | ICD-10-CM | POA: Diagnosis not present

## 2023-12-04 DIAGNOSIS — E785 Hyperlipidemia, unspecified: Secondary | ICD-10-CM | POA: Diagnosis not present

## 2023-12-04 DIAGNOSIS — I10 Essential (primary) hypertension: Secondary | ICD-10-CM | POA: Diagnosis not present

## 2023-12-04 DIAGNOSIS — E039 Hypothyroidism, unspecified: Secondary | ICD-10-CM | POA: Diagnosis not present

## 2023-12-04 DIAGNOSIS — R609 Edema, unspecified: Secondary | ICD-10-CM | POA: Diagnosis not present

## 2024-01-04 DIAGNOSIS — Z130 Encounter for screening for diseases of the blood and blood-forming organs and certain disorders involving the immune mechanism: Secondary | ICD-10-CM | POA: Diagnosis not present

## 2024-01-04 DIAGNOSIS — Z0000 Encounter for general adult medical examination without abnormal findings: Secondary | ICD-10-CM | POA: Diagnosis not present

## 2024-01-04 DIAGNOSIS — D519 Vitamin B12 deficiency anemia, unspecified: Secondary | ICD-10-CM | POA: Diagnosis not present

## 2024-01-29 ENCOUNTER — Encounter: Payer: Self-pay | Admitting: Gastroenterology

## 2024-01-29 ENCOUNTER — Encounter: Payer: Self-pay | Admitting: *Deleted

## 2024-01-29 ENCOUNTER — Ambulatory Visit: Admitting: Gastroenterology

## 2024-01-29 VITALS — BP 115/73 | HR 71 | Temp 98.8°F | Ht 65.0 in | Wt 298.8 lb

## 2024-01-29 DIAGNOSIS — R1310 Dysphagia, unspecified: Secondary | ICD-10-CM | POA: Insufficient documentation

## 2024-01-29 DIAGNOSIS — R932 Abnormal findings on diagnostic imaging of liver and biliary tract: Secondary | ICD-10-CM | POA: Diagnosis not present

## 2024-01-29 DIAGNOSIS — D509 Iron deficiency anemia, unspecified: Secondary | ICD-10-CM | POA: Insufficient documentation

## 2024-01-29 DIAGNOSIS — D500 Iron deficiency anemia secondary to blood loss (chronic): Secondary | ICD-10-CM

## 2024-01-29 DIAGNOSIS — R195 Other fecal abnormalities: Secondary | ICD-10-CM | POA: Diagnosis not present

## 2024-01-29 DIAGNOSIS — K219 Gastro-esophageal reflux disease without esophagitis: Secondary | ICD-10-CM | POA: Insufficient documentation

## 2024-01-29 DIAGNOSIS — R1319 Other dysphagia: Secondary | ICD-10-CM

## 2024-01-29 DIAGNOSIS — K5900 Constipation, unspecified: Secondary | ICD-10-CM | POA: Diagnosis not present

## 2024-01-29 NOTE — Telephone Encounter (Signed)
 Pt stated she needed to reschedule her procedure due to grandson's award ceremony on the morning of 02/13/24. She has been rescheduled for Monday 02/18/24. Updated instructions sent via mychart.

## 2024-01-29 NOTE — H&P (View-Only) (Signed)
 GI Office Note    Referring Provider: Leesa Pulling, MD Primary Care Physician:  Leesa Pulling, MD  Primary Gastroenterologist: Rolando Cliche. Mordechai April, DO   Chief Complaint   Chief Complaint  Patient presents with   Anemia    History of Present Illness   Diane Phillips is a 68 y.o. female presenting today at the request of Katherine Skillman, PA-C for anemia, heme positive stool, with recent negative colonoscopy.  Today: colonoscopy in 10/2023 for routine screening. Can goes days and weeks without a BM and then will see brbpr. Saw blood when completed hemoccult. Diagnosed with IBS-C in her 30s. Sensitive to laxatives. Would have diarrhea for several days. Sometimes foods triggers. Works 2-3 days per week and can't have to stay in the bathroom all the time.   Has been fatigued. No black stools. Pasta/rice feels like stuck in esophagus. Hurts when this happened. Will heave and only large amounts of mucous will come up. No heartburn on pantoprazole. Maybe had remote EGD 40 years ago, [employer] (ex was in [employer]).   Some ruq pain, worse with movement lately, happened once or twice this year. Had work up in 2022.   Was on naprosyn, was on recently. Last naprosyn was one week ago. No ASA. No blood donation.  Has appt next month to update labs.     Labs 11/2023: Hgb 10.4, Hct 31.8, MCV 82.6, WBC 6.3, Platelet 296, na 138, k 4.4 bun 22, cre 0.74, tbili 0.4, AP 112, AST 6, ALT 6, alb 3.9, A!C 5.5  Labs 12/2023: Iron 40, TIBC 441, ferritin 10, B12 386, folate 5.4, hemoglobin 10.3, hematocrit 35.3, iron sat 9%, MCV 87  Labs from September 2024: Hemoglobin 11.7 normal.   CT A/P without contrast 01/2021. IMPRESSION:  1. No nephrolithiasis, ureterolithiasis or obstructive uropathy.  2. Heterogeneous density within the liver is nonspecific.   Colonoscopy 10/2023:                                         Impression:     - Perianal skin tags found on perianal exam.                          - Diverticulosis in the sigmoid colon.                         - No specimens collected.   Medications   Current Outpatient Medications  Medication Sig Dispense Refill   cholecalciferol (VITAMIN D3) 25 MCG (1000 UNIT) tablet Take 1,000 Units by mouth daily.     ferrous sulfate 325 (65 FE) MG EC tablet Take 325 mg by mouth 3 (three) times daily with meals.     furosemide (LASIX) 20 MG tablet Take 20 mg by mouth daily. As needed     gabapentin (NEURONTIN) 300 MG capsule Take 300 mg by mouth 3 (three) times daily.     levothyroxine (SYNTHROID) 112 MCG tablet Take 112 mcg by mouth daily before breakfast.     loratadine (CLARITIN) 10 MG tablet Take 10 mg by mouth daily.     metoprolol tartrate (LOPRESSOR) 50 MG tablet Take 50 mg by mouth 2 (two) times daily.     nystatin cream (MYCOSTATIN) Apply 1 Application topically 3 (three) times daily.     Omega-3 1000 MG CAPS  Take 1 capsule by mouth 2 (two) times daily.     pantoprazole (PROTONIX) 40 MG tablet Take 40 mg by mouth daily.            simvastatin (ZOCOR) 20 MG tablet Take 20 mg by mouth daily.     solifenacin (VESICARE) 10 MG tablet Take by mouth daily.     traZODone (DESYREL) 50 MG tablet Take 50 mg by mouth at bedtime.     triamterene-hydrochlorothiazide (MAXZIDE-25) 37.5-25 MG tablet Take 1 tablet by mouth daily.     No current facility-administered medications for this visit.    Allergies   Allergies as of 01/29/2024   (No Known Allergies)    Past Medical History   Past Medical History:  Diagnosis Date   GERD (gastroesophageal reflux disease)    Hyperlipidemia    Hypertension    Hypothyroidism    OA (osteoarthritis)    Vitamin D deficiency     Past Surgical History   Past Surgical History:  Procedure Laterality Date   ABDOMINAL HYSTERECTOMY     CATARACT EXTRACTION W/PHACO Right 11/03/2022   Procedure: CATARACT EXTRACTION PHACO AND INTRAOCULAR LENS PLACEMENT (IOC);  Surgeon: Ardeth Krabbe, MD;  Location: [facility];  Service: Ophthalmology;  Laterality: Right;  CDE: 3.28   CATARACT EXTRACTION W/PHACO Left 12/01/2022   Procedure: CATARACT EXTRACTION PHACO AND INTRAOCULAR LENS PLACEMENT (IOC);  Surgeon: Ardeth Krabbe, MD;  Location: [facility];  Service: Ophthalmology;  Laterality: Left;  CDE: 1.77   CERVICAL FUSION     CHOLECYSTECTOMY     HEEL SPUR SURGERY Right    HERNIA REPAIR     TONSILLECTOMY      Past Family History   No family history on file.  Past Social History   Social History   Socioeconomic History   Marital status: Married    Spouse name: Not on file   Number of children: Not on file   Years of education: Not on file   Highest education level: Not on file  Occupational History   Not on file  Tobacco Use   Smoking status: Former    Current packs/day: 0.00    Average packs/day: 2.0 packs/day for 40.0 years (80.0 ttl pk-yrs)    Types: Cigarettes    Start date: 10/26/1975    Quit date: 10/26/2015    Years since quitting: 8.2   Smokeless tobacco: Never  Vaping Use   Vaping status: Never Used  Substance and Sexual Activity   Alcohol use: Not Currently   Drug use: Not Currently   Sexual activity: Yes  Other Topics Concern   Not on file  Social History Narrative   Not on file   Social Drivers of Health   Financial Resource Strain: Not on file  Food Insecurity: Not on file  Transportation Needs: Not on file  Physical Activity: Not on file  Stress: Not on file  Social Connections: Not on file  Intimate Partner Violence: Not on file    Review of Systems   General: Negative for anorexia, weight loss, fever, chills,+ fatigue, weakness. Eyes: Negative for vision changes.  ENT: Negative for hoarseness, difficulty swallowing , nasal congestion. CV: Negative for chest pain, angina, palpitations, dyspnea on exertion, peripheral edema.  Respiratory: Negative for dyspnea at rest, dyspnea on exertion, cough, sputum, wheezing.  GI: See history of present illness. GU:   Negative for dysuria, hematuria, urinary incontinence, urinary frequency, nocturnal urination.  MS: + joint pain. no low back pain.  Derm: Negative for rash  or itching.  Neuro: Negative for weakness, abnormal sensation, seizure, frequent headaches, memory loss,  confusion.  Psych: Negative for anxiety, depression, suicidal ideation, hallucinations.  Endo: Negative for unusual weight change.  Heme: Negative for bruising or bleeding. Allergy: Negative for rash or hives.  Physical Exam   BP 115/73 (BP Location: Right Arm, Patient Position: Sitting, Cuff Size: Large)   Pulse 71   Temp 98.8 F (37.1 C) (Oral)   Ht 5\' 5"  (1.651 m)   Wt 298 lb 12.8 oz (135.5 kg)   SpO2 95%   BMI 49.72 kg/m    General: Well-nourished, well-developed in no acute distress.  Head: Normocephalic, atraumatic.   Eyes: Conjunctiva pink, no icterus. Mouth: Oropharyngeal mucosa moist and pink   Neck: Supple without thyromegaly, masses, or lymphadenopathy.  Lungs: Clear to auscultation bilaterally.  Heart: Regular rate and rhythm, no murmurs rubs or gallops.  Abdomen: Bowel sounds are normal,  nondistended, no hepatosplenomegaly or masses,  no abdominal bruits or hernia, no rebound or guarding.  Mild ruq tenderness. Rectal: not performed Extremities: No lower extremity edema. No clubbing or deformities.  Neuro: Alert and oriented x 4 , grossly normal neurologically.  Skin: Warm and dry, no rash or jaundice.   Psych: Alert and cooperative, normal mood and affect.  Labs   See hpi  Imaging Studies   No results found.  Assessment/Plan:   IDA/heme positive stool: recent new onset IDA, noted in March 2025. Normal H/H in 05/2023. Stool was heme positive but she did note brbpr at time of stool collection in the setting of straining. Has issues with constipation/straining and sees mild brbpr at times. She has had recent naprosyn use, cannot rule out NSAID related occult GI bleeding. Cannot rule out AVMs.  -EGD  with Dr. Mordechai April. ASA 3.  I have discussed the risks, alternatives, benefits with regards to but not limited to the risk of reaction to medication, bleeding, infection, perforation and the patient is agreeable to proceed. Written consent to be obtained. -screen for celiac disease  -colonoscopy recently completed in 10/2023 by Dr. Louanne Roussel.   Constipation: trial of Linzess 72mcg on days she does not work. She is fearful of taking on work days due to previously being very sensitive to laxatives.   Heterogenous densities within the liver on noncontrast CT in 2022, felt to be nonspecific. -consider ruq u/s in near future.    Trudie Fuse. Harles Lied, MHS, PA-C [facility]

## 2024-01-29 NOTE — Progress Notes (Signed)
 GI Office Note    Referring Provider: Leesa Pulling, MD Primary Care Physician:  Leesa Pulling, MD  Primary Gastroenterologist: Rolando Cliche. Mordechai April, DO   Chief Complaint   Chief Complaint  Patient presents with   Anemia    History of Present Illness   Diane Phillips is a 68 y.o. female presenting today at the request of Katherine Skillman, PA-C for anemia, heme positive stool, with recent negative colonoscopy.  Today: colonoscopy in 10/2023 for routine screening. Can goes days and weeks without a BM and then will see brbpr. Saw blood when completed hemoccult. Diagnosed with IBS-C in her 30s. Sensitive to laxatives. Would have diarrhea for several days. Sometimes foods triggers. Works 2-3 days per week and can't have to stay in the bathroom all the time.   Has been fatigued. No black stools. Pasta/rice feels like stuck in esophagus. Hurts when this happened. Will heave and only large amounts of mucous will come up. No heartburn on pantoprazole. Maybe had remote EGD 40 years ago, [employer] (ex was in [employer]).   Some ruq pain, worse with movement lately, happened once or twice this year. Had work up in 2022.   Was on naprosyn, was on recently. Last naprosyn was one week ago. No ASA. No blood donation.  Has appt next month to update labs.     Labs 11/2023: Hgb 10.4, Hct 31.8, MCV 82.6, WBC 6.3, Platelet 296, na 138, k 4.4 bun 22, cre 0.74, tbili 0.4, AP 112, AST 6, ALT 6, alb 3.9, A!C 5.5  Labs 12/2023: Iron 40, TIBC 441, ferritin 10, B12 386, folate 5.4, hemoglobin 10.3, hematocrit 35.3, iron sat 9%, MCV 87  Labs from September 2024: Hemoglobin 11.7 normal.   CT A/P without contrast 01/2021. IMPRESSION:  1. No nephrolithiasis, ureterolithiasis or obstructive uropathy.  2. Heterogeneous density within the liver is nonspecific.   Colonoscopy 10/2023:                                         Impression:     - Perianal skin tags found on perianal exam.                          - Diverticulosis in the sigmoid colon.                         - No specimens collected.   Medications   Current Outpatient Medications  Medication Sig Dispense Refill   cholecalciferol (VITAMIN D3) 25 MCG (1000 UNIT) tablet Take 1,000 Units by mouth daily.     ferrous sulfate 325 (65 FE) MG EC tablet Take 325 mg by mouth 3 (three) times daily with meals.     furosemide (LASIX) 20 MG tablet Take 20 mg by mouth daily. As needed     gabapentin (NEURONTIN) 300 MG capsule Take 300 mg by mouth 3 (three) times daily.     levothyroxine (SYNTHROID) 112 MCG tablet Take 112 mcg by mouth daily before breakfast.     loratadine (CLARITIN) 10 MG tablet Take 10 mg by mouth daily.     metoprolol tartrate (LOPRESSOR) 50 MG tablet Take 50 mg by mouth 2 (two) times daily.     nystatin cream (MYCOSTATIN) Apply 1 Application topically 3 (three) times daily.     Omega-3 1000 MG CAPS  Take 1 capsule by mouth 2 (two) times daily.     pantoprazole (PROTONIX) 40 MG tablet Take 40 mg by mouth daily.            simvastatin (ZOCOR) 20 MG tablet Take 20 mg by mouth daily.     solifenacin (VESICARE) 10 MG tablet Take by mouth daily.     traZODone (DESYREL) 50 MG tablet Take 50 mg by mouth at bedtime.     triamterene-hydrochlorothiazide (MAXZIDE-25) 37.5-25 MG tablet Take 1 tablet by mouth daily.     No current facility-administered medications for this visit.    Allergies   Allergies as of 01/29/2024   (No Known Allergies)    Past Medical History   Past Medical History:  Diagnosis Date   GERD (gastroesophageal reflux disease)    Hyperlipidemia    Hypertension    Hypothyroidism    OA (osteoarthritis)    Vitamin D deficiency     Past Surgical History   Past Surgical History:  Procedure Laterality Date   ABDOMINAL HYSTERECTOMY     CATARACT EXTRACTION W/PHACO Right 11/03/2022   Procedure: CATARACT EXTRACTION PHACO AND INTRAOCULAR LENS PLACEMENT (IOC);  Surgeon: Ardeth Krabbe, MD;  Location: [facility];  Service: Ophthalmology;  Laterality: Right;  CDE: 3.28   CATARACT EXTRACTION W/PHACO Left 12/01/2022   Procedure: CATARACT EXTRACTION PHACO AND INTRAOCULAR LENS PLACEMENT (IOC);  Surgeon: Ardeth Krabbe, MD;  Location: [facility];  Service: Ophthalmology;  Laterality: Left;  CDE: 1.77   CERVICAL FUSION     CHOLECYSTECTOMY     HEEL SPUR SURGERY Right    HERNIA REPAIR     TONSILLECTOMY      Past Family History   No family history on file.  Past Social History   Social History   Socioeconomic History   Marital status: Married    Spouse name: Not on file   Number of children: Not on file   Years of education: Not on file   Highest education level: Not on file  Occupational History   Not on file  Tobacco Use   Smoking status: Former    Current packs/day: 0.00    Average packs/day: 2.0 packs/day for 40.0 years (80.0 ttl pk-yrs)    Types: Cigarettes    Start date: 10/26/1975    Quit date: 10/26/2015    Years since quitting: 8.2   Smokeless tobacco: Never  Vaping Use   Vaping status: Never Used  Substance and Sexual Activity   Alcohol use: Not Currently   Drug use: Not Currently   Sexual activity: Yes  Other Topics Concern   Not on file  Social History Narrative   Not on file   Social Drivers of Health   Financial Resource Strain: Not on file  Food Insecurity: Not on file  Transportation Needs: Not on file  Physical Activity: Not on file  Stress: Not on file  Social Connections: Not on file  Intimate Partner Violence: Not on file    Review of Systems   General: Negative for anorexia, weight loss, fever, chills,+ fatigue, weakness. Eyes: Negative for vision changes.  ENT: Negative for hoarseness, difficulty swallowing , nasal congestion. CV: Negative for chest pain, angina, palpitations, dyspnea on exertion, peripheral edema.  Respiratory: Negative for dyspnea at rest, dyspnea on exertion, cough, sputum, wheezing.  GI: See history of present illness. GU:   Negative for dysuria, hematuria, urinary incontinence, urinary frequency, nocturnal urination.  MS: + joint pain. no low back pain.  Derm: Negative for rash  or itching.  Neuro: Negative for weakness, abnormal sensation, seizure, frequent headaches, memory loss,  confusion.  Psych: Negative for anxiety, depression, suicidal ideation, hallucinations.  Endo: Negative for unusual weight change.  Heme: Negative for bruising or bleeding. Allergy: Negative for rash or hives.  Physical Exam   BP 115/73 (BP Location: Right Arm, Patient Position: Sitting, Cuff Size: Large)   Pulse 71   Temp 98.8 F (37.1 C) (Oral)   Ht 5\' 5"  (1.651 m)   Wt 298 lb 12.8 oz (135.5 kg)   SpO2 95%   BMI 49.72 kg/m    General: Well-nourished, well-developed in no acute distress.  Head: Normocephalic, atraumatic.   Eyes: Conjunctiva pink, no icterus. Mouth: Oropharyngeal mucosa moist and pink   Neck: Supple without thyromegaly, masses, or lymphadenopathy.  Lungs: Clear to auscultation bilaterally.  Heart: Regular rate and rhythm, no murmurs rubs or gallops.  Abdomen: Bowel sounds are normal,  nondistended, no hepatosplenomegaly or masses,  no abdominal bruits or hernia, no rebound or guarding.  Mild ruq tenderness. Rectal: not performed Extremities: No lower extremity edema. No clubbing or deformities.  Neuro: Alert and oriented x 4 , grossly normal neurologically.  Skin: Warm and dry, no rash or jaundice.   Psych: Alert and cooperative, normal mood and affect.  Labs   See hpi  Imaging Studies   No results found.  Assessment/Plan:   IDA/heme positive stool: recent new onset IDA, noted in March 2025. Normal H/H in 05/2023. Stool was heme positive but she did note brbpr at time of stool collection in the setting of straining. Has issues with constipation/straining and sees mild brbpr at times. She has had recent naprosyn use, cannot rule out NSAID related occult GI bleeding. Cannot rule out AVMs.  -EGD  with Dr. Mordechai April. ASA 3.  I have discussed the risks, alternatives, benefits with regards to but not limited to the risk of reaction to medication, bleeding, infection, perforation and the patient is agreeable to proceed. Written consent to be obtained. -screen for celiac disease  -colonoscopy recently completed in 10/2023 by Dr. Louanne Roussel.   Constipation: trial of Linzess 72mcg on days she does not work. She is fearful of taking on work days due to previously being very sensitive to laxatives.   Heterogenous densities within the liver on noncontrast CT in 2022, felt to be nonspecific. -consider ruq u/s in near future.    Trudie Fuse. Harles Lied, MHS, PA-C [facility]

## 2024-01-29 NOTE — Patient Instructions (Signed)
 Samples of Linzess provided today for constipation. You can take on your days off to prevent significant constipation/avoid straining. Let me know if you would like prescription.  Upper endoscopy to be scheduled.  We will plan on labs for celiac screening during your pre-op visit.

## 2024-02-06 DIAGNOSIS — K219 Gastro-esophageal reflux disease without esophagitis: Secondary | ICD-10-CM | POA: Diagnosis not present

## 2024-02-06 DIAGNOSIS — R0789 Other chest pain: Secondary | ICD-10-CM | POA: Diagnosis not present

## 2024-02-06 DIAGNOSIS — I10 Essential (primary) hypertension: Secondary | ICD-10-CM | POA: Diagnosis not present

## 2024-02-06 DIAGNOSIS — D500 Iron deficiency anemia secondary to blood loss (chronic): Secondary | ICD-10-CM | POA: Diagnosis not present

## 2024-02-11 ENCOUNTER — Encounter (HOSPITAL_COMMUNITY)
Admission: RE | Admit: 2024-02-11 | Discharge: 2024-02-11 | Disposition: A | Discharge status: Home / Self Care | Source: Ambulatory Visit | Attending: Internal Medicine | Admitting: Internal Medicine

## 2024-02-11 DIAGNOSIS — I10 Essential (primary) hypertension: Secondary | ICD-10-CM

## 2024-02-11 DIAGNOSIS — Z79899 Other long term (current) drug therapy: Secondary | ICD-10-CM

## 2024-02-13 ENCOUNTER — Encounter (HOSPITAL_COMMUNITY): Payer: Self-pay | Admitting: Internal Medicine

## 2024-02-14 DIAGNOSIS — J439 Emphysema, unspecified: Secondary | ICD-10-CM | POA: Diagnosis not present

## 2024-02-14 DIAGNOSIS — R0789 Other chest pain: Secondary | ICD-10-CM | POA: Diagnosis not present

## 2024-02-14 DIAGNOSIS — R911 Solitary pulmonary nodule: Secondary | ICD-10-CM | POA: Diagnosis not present

## 2024-02-14 DIAGNOSIS — R9389 Abnormal findings on diagnostic imaging of other specified body structures: Secondary | ICD-10-CM | POA: Diagnosis not present

## 2024-02-14 DIAGNOSIS — R079 Chest pain, unspecified: Secondary | ICD-10-CM | POA: Diagnosis not present

## 2024-02-18 ENCOUNTER — Encounter (HOSPITAL_COMMUNITY)
Admission: RE | Disposition: A | Discharge status: Home / Self Care | Payer: Self-pay | Source: Home / Self Care | Attending: Internal Medicine

## 2024-02-18 ENCOUNTER — Ambulatory Visit (HOSPITAL_COMMUNITY): Admitting: Anesthesiology

## 2024-02-18 ENCOUNTER — Ambulatory Visit (HOSPITAL_COMMUNITY)
Admission: RE | Admit: 2024-02-18 | Discharge: 2024-02-18 | Disposition: A | Discharge status: Home / Self Care | Attending: Internal Medicine | Admitting: Internal Medicine

## 2024-02-18 ENCOUNTER — Encounter (HOSPITAL_COMMUNITY): Payer: Self-pay | Admitting: Internal Medicine

## 2024-02-18 DIAGNOSIS — K2950 Unspecified chronic gastritis without bleeding: Secondary | ICD-10-CM

## 2024-02-18 DIAGNOSIS — E039 Hypothyroidism, unspecified: Secondary | ICD-10-CM | POA: Insufficient documentation

## 2024-02-18 DIAGNOSIS — Z87891 Personal history of nicotine dependence: Secondary | ICD-10-CM | POA: Insufficient documentation

## 2024-02-18 DIAGNOSIS — K317 Polyp of stomach and duodenum: Secondary | ICD-10-CM | POA: Diagnosis not present

## 2024-02-18 DIAGNOSIS — K449 Diaphragmatic hernia without obstruction or gangrene: Secondary | ICD-10-CM

## 2024-02-18 DIAGNOSIS — E66813 Obesity, class 3: Secondary | ICD-10-CM | POA: Diagnosis not present

## 2024-02-18 DIAGNOSIS — K3189 Other diseases of stomach and duodenum: Secondary | ICD-10-CM | POA: Diagnosis not present

## 2024-02-18 DIAGNOSIS — Z79899 Other long term (current) drug therapy: Secondary | ICD-10-CM | POA: Diagnosis not present

## 2024-02-18 DIAGNOSIS — D509 Iron deficiency anemia, unspecified: Secondary | ICD-10-CM | POA: Diagnosis not present

## 2024-02-18 DIAGNOSIS — K219 Gastro-esophageal reflux disease without esophagitis: Secondary | ICD-10-CM | POA: Insufficient documentation

## 2024-02-18 DIAGNOSIS — Z6841 Body Mass Index (BMI) 40.0 and over, adult: Secondary | ICD-10-CM | POA: Diagnosis not present

## 2024-02-18 DIAGNOSIS — I10 Essential (primary) hypertension: Secondary | ICD-10-CM | POA: Insufficient documentation

## 2024-02-18 DIAGNOSIS — R1310 Dysphagia, unspecified: Secondary | ICD-10-CM

## 2024-02-18 DIAGNOSIS — K2970 Gastritis, unspecified, without bleeding: Secondary | ICD-10-CM | POA: Diagnosis not present

## 2024-02-18 DIAGNOSIS — K581 Irritable bowel syndrome with constipation: Secondary | ICD-10-CM | POA: Diagnosis not present

## 2024-02-18 DIAGNOSIS — Z79890 Hormone replacement therapy (postmenopausal): Secondary | ICD-10-CM | POA: Insufficient documentation

## 2024-02-18 DIAGNOSIS — K31A19 Gastric intestinal metaplasia without dysplasia, unspecified site: Secondary | ICD-10-CM

## 2024-02-18 HISTORY — PX: ESOPHAGOGASTRODUODENOSCOPY: SHX5428

## 2024-02-18 HISTORY — PX: ESOPHAGEAL DILATION: SHX303

## 2024-02-18 LAB — BASIC METABOLIC PANEL WITH GFR
Anion gap: 7 (ref 5–15)
BUN: 16 mg/dL (ref 8–23)
CO2: 26 mmol/L (ref 22–32)
Calcium: 8.7 mg/dL — ABNORMAL LOW (ref 8.9–10.3)
Chloride: 106 mmol/L (ref 98–111)
Creatinine, Ser: 0.64 mg/dL (ref 0.44–1.00)
GFR, Estimated: >60 mL/min (ref >60)
Glucose, Bld: 114 mg/dL — ABNORMAL HIGH (ref 70–99)
Potassium: 4.4 mmol/L (ref 3.5–5.1)
Sodium: 139 mmol/L (ref 135–145)

## 2024-02-18 SURGERY — EGD (ESOPHAGOGASTRODUODENOSCOPY)
Anesthesia: General

## 2024-02-18 MED ORDER — PROPOFOL 10 MG/ML IV BOLUS
INTRAVENOUS | Status: DC | PRN
  Administered 2024-02-18: 130 mg via INTRAVENOUS

## 2024-02-18 MED ORDER — LIDOCAINE 2% (20 MG/ML) 5 ML SYRINGE
INTRAMUSCULAR | Status: DC | PRN
  Administered 2024-02-18: 100 mg via INTRAVENOUS

## 2024-02-18 MED ORDER — LACTATED RINGERS IV SOLN: INTRAVENOUS | Status: DC | PRN

## 2024-02-18 NOTE — Anesthesia Procedure Notes (Signed)
 Date/Time: 02/18/2024 9:51 AM  Performed by: Sherwin Donate, CRNAPre-anesthesia Checklist: Patient identified, Emergency Drugs available, Suction available and Patient being monitored Patient Re-evaluated:Patient Re-evaluated prior to induction Oxygen Delivery Method: Nasal cannula Induction Type: IV induction Placement Confirmation: positive ETCO2 Comments: Optiflow High Flow [place] O2 used.

## 2024-02-18 NOTE — Transfer of Care (Signed)
 Immediate Anesthesia Transfer of Care Note  Patient: Diane Phillips  Procedure(s) Performed: EGD (ESOPHAGOGASTRODUODENOSCOPY) DILATION, ESOPHAGUS  Patient Location: Short Stay  Anesthesia Type:General  Level of Consciousness: awake, alert , and oriented  Airway & Oxygen Therapy: Patient Spontanous Breathing  Post-op Assessment: Report given to RN and Post -op Vital signs reviewed and stable  Post vital signs: Reviewed and stable  Last Vitals:  Vitals Value Taken Time  BP 100/50 02/18/24 1009  Temp 36.6 C 02/18/24 1009  Pulse 54 02/18/24 1009  Resp 20 02/18/24 1009  SpO2 97 % 02/18/24 1009    Last Pain:  Vitals:   02/18/24 1009  TempSrc: Oral  PainSc: 0-No pain         Complications: No notable events documented.

## 2024-02-18 NOTE — Discharge Instructions (Addendum)
 EGD Discharge instructions Please read the instructions outlined below and refer to this sheet in the next few weeks. These discharge instructions provide you with general information on caring for yourself after you leave the hospital. Your doctor may also give you specific instructions. While your treatment has been planned according to the most current medical practices available, unavoidable complications occasionally occur. If you have any problems or questions after discharge, please call your doctor. ACTIVITY You may resume your regular activity but move at a slower pace for the next 24 hours.  Take frequent rest periods for the next 24 hours.  Walking will help expel (get rid of) the air and reduce the bloated feeling in your abdomen.  No driving for 24 hours (because of the anesthesia (medicine) used during the test).  You may shower.  Do not sign any important legal documents or operate any machinery for 24 hours (because of the anesthesia used during the test).  NUTRITION Drink plenty of fluids.  You may resume your normal diet.  Begin with a light meal and progress to your normal diet.  Avoid alcoholic beverages for 24 hours or as instructed by your caregiver.  MEDICATIONS You may resume your normal medications unless your caregiver tells you otherwise.  WHAT YOU CAN EXPECT TODAY You may experience abdominal discomfort such as a feeling of fullness or "gas" pains.  FOLLOW-UP Your doctor will discuss the results of your test with you.  SEEK IMMEDIATE MEDICAL ATTENTION IF ANY OF THE FOLLOWING OCCUR: Excessive nausea (feeling sick to your stomach) and/or vomiting.  Severe abdominal pain and distention (swelling).  Trouble swallowing.  Temperature over 101 F (37.8 C).  Rectal bleeding or vomiting of blood.   Your EGD revealed mild amount inflammation in your stomach.  I took biopsies of this to rule out infection with a bacteria called H. pylori.  I also took samples of your  small bowel to check for celiac disease which can lead to anemia.  Await pathology results, my office will contact you.  2 small polyps in your stomach which I removed.  We will call with these results as well.  You have a small hiatal hernia.  Mild tightening of your esophagus which I did stretch today.  Hopefully this helps with feeling of food getting stuck.  Continue on pantoprazole daily.  Follow-up with GI in 2 to 3 months.  I hope you have a great rest of your week!  Rolando Cliche. Mordechai April, D.O. Gastroenterology and Hepatology [facility]

## 2024-02-18 NOTE — Anesthesia Postprocedure Evaluation (Signed)
 Anesthesia Post Note  Patient: Diane Phillips  Procedure(s) Performed: EGD (ESOPHAGOGASTRODUODENOSCOPY) DILATION, ESOPHAGUS  Patient location during evaluation: PACU Anesthesia Type: General Level of consciousness: awake and alert Pain management: pain level controlled Vital Signs Assessment: post-procedure vital signs reviewed and stable Respiratory status: spontaneous breathing, nonlabored ventilation, respiratory function stable and patient connected to nasal cannula oxygen Cardiovascular status: blood pressure returned to baseline and stable Postop Assessment: no apparent nausea or vomiting Anesthetic complications: no   There were no known notable events for this encounter.   Last Vitals:  Vitals:   02/18/24 1009  BP: (!) 100/50  Pulse: (!) 54  Resp: 20  Temp: 36.6 C  SpO2: 97%    Last Pain:  Vitals:   02/18/24 1009  TempSrc: Oral  PainSc: 0-No pain                 Omarion Minnehan L Braedon Sjogren

## 2024-02-18 NOTE — Interval H&P Note (Signed)
 History and Physical Interval Note:  02/18/2024 9:27 AM  Diane Phillips  has presented today for surgery, with the diagnosis of IDA, dysphagia.  The various methods of treatment have been discussed with the patient and family. After consideration of risks, benefits and other options for treatment, the patient has consented to  Procedure(s) with comments: EGD (ESOPHAGOGASTRODUODENOSCOPY) (N/A) - 130PM, OK RM 1-2 DILATION, ESOPHAGUS (N/A) as a surgical intervention.  The patient's history has been reviewed, patient examined, no change in status, stable for surgery.  I have reviewed the patient's chart and labs.  Questions were answered to the patient's satisfaction.     Vinetta Greening

## 2024-02-18 NOTE — Anesthesia Preprocedure Evaluation (Signed)
 Anesthesia Evaluation  Patient identified by MRN, date of birth, ID band Patient awake    Reviewed: Allergy & Precautions, H&P , NPO status , Patient's Chart, lab work & pertinent test results, reviewed documented beta blocker date and time   Airway Mallampati: III  TM Distance: >3 FB Neck ROM: Full    Dental  (+) Dental Advisory Given, Upper Dentures, Partial Lower   Pulmonary shortness of breath and with exertion, former smoker   Pulmonary exam normal breath sounds clear to auscultation       Cardiovascular hypertension, Pt. on medications and Pt. on home beta blockers Normal cardiovascular exam Rhythm:Regular Rate:Normal     Neuro/Psych negative neurological ROS  negative psych ROS   GI/Hepatic Neg liver ROS,GERD  Medicated and Controlled,,  Endo/Other  Hypothyroidism  Class 3 obesity  Renal/GU negative Renal ROS  negative genitourinary   Musculoskeletal  (+) Arthritis , Osteoarthritis,    Abdominal   Peds negative pediatric ROS (+)  Hematology negative hematology ROS (+)   Anesthesia Other Findings   Reproductive/Obstetrics negative OB ROS                             Anesthesia Physical Anesthesia Plan  ASA: 3  Anesthesia Plan: General   Post-op Pain Management: Minimal or no pain anticipated   Induction: Intravenous  PONV Risk Score and Plan: Propofol infusion  Airway Management Planned: Nasal Cannula and Natural Airway  Additional Equipment: None  Intra-op Plan:   Post-operative Plan:   Informed Consent: I have reviewed the patients History and Physical, chart, labs and discussed the procedure including the risks, benefits and alternatives for the proposed anesthesia with the patient or authorized representative who has indicated his/her understanding and acceptance.     Dental advisory given  Plan Discussed with: CRNA  Anesthesia Plan Comments:         Anesthesia Quick Evaluation

## 2024-02-18 NOTE — Op Note (Signed)
 [facility] Patient Name: Diane Phillips Procedure Date: 02/18/2024 9:39 AM MRN: [id number] Date of Birth: 05-07-1956 Attending MD: Rolando Cliche. Roel Clarity, [id number] CSN: [id number] Age: 68 Admit Type: Outpatient Procedure:                Upper GI endoscopy Indications:              Iron deficiency anemia, Dysphagia Providers:                Rolando Cliche. Mordechai April, DO, Crystal Page, Kristine L.                            Roberta Chin, Technician Referring MD:              Medicines:                See the Anesthesia note for documentation of the                            administered medications Complications:            No immediate complications. Estimated Blood Loss:     Estimated blood loss was minimal. Procedure:                Pre-Anesthesia Assessment:                           - The anesthesia plan was to use monitored                            anesthesia care (MAC).                           After obtaining informed consent, the endoscope was                            passed under direct vision. Throughout the                            procedure, the patient's blood pressure, pulse, and                            oxygen saturations were monitored continuously. The                            GIF-H190 ([id number]) scope was introduced through the                            mouth, and advanced to the second part of duodenum.                            The upper GI endoscopy was accomplished without                            difficulty. The patient tolerated the procedure                            well. Scope  In: 9:56:45 AM Scope Out: 10:04:58 AM Total Procedure Duration: 0 hours 8 minutes 13 seconds  Findings:      A small hiatal hernia was present.      No endoscopic abnormality was evident in the esophagus to explain the       patient's complaint of dysphagia. Preparations were made for empiric       dilation. A TTS dilator was passed through the scope. Dilation with an        18-19-20 mm balloon dilator was performed to 20 mm. Dilation was       performed with a mild resistance at 20 mm. Estimated blood loss was none.      Patchy mild inflammation characterized by erythema was found in the       entire examined stomach. Biopsies were taken with a cold forceps for       Helicobacter pylori testing.      Two 3 to 7 mm sessile polyps with no bleeding and no stigmata of recent       bleeding were found in the gastric antrum. The polyp was removed with a       cold snare. Resection and retrieval were complete.      The duodenal bulb, first portion of the duodenum and second portion of       the duodenum were normal. Impression:               - Small hiatal hernia.                           - Gastritis. Biopsied.                           - Two gastric polyps. Resected and retrieved.                           - Normal duodenal bulb, first portion of the                            duodenum and second portion of the duodenum. Moderate Sedation:      Per Anesthesia Care Recommendation:           - Patient has a contact number available for                            emergencies. The signs and symptoms of potential                            delayed complications were discussed with the                            patient. Return to normal activities tomorrow.                            Written discharge instructions were provided to the                            patient.                           -  Resume previous diet.                           - Continue present medications.                           - Await pathology results.                           - Return to GI clinic in 3 months.                           - Use Protonix (pantoprazole) 40 mg PO daily.                           - Consider capsule endoscopy to complete GI work up                            if Hgb drops <10 Procedure Code(s):        --- Professional ---                           [phone],  Esophagogastroduodenoscopy, flexible,                            transoral; with removal of tumor(s), polyp(s), or                            other lesion(s) by snare technique                           43239, 59, Esophagogastroduodenoscopy, flexible,                            transoral; with biopsy, single or multiple Diagnosis Code(s):        --- Professional ---                           K44.9, Diaphragmatic hernia without obstruction or                            gangrene                           K29.70, Gastritis, unspecified, without bleeding                           K31.7, Polyp of stomach and duodenum                           D50.9, Iron deficiency anemia, unspecified                           R13.10, Dysphagia, unspecified CPT copyright 2022 [employer]. All rights reserved. The codes documented in this report are preliminary and upon coder review may  be revised to meet current compliance requirements. Rolando Cliche. Mordechai April, DO Rolando Cliche. Mordechai April,  DO 02/18/2024 10:55:01 AM This report has been signed electronically. Number of Addenda: 0

## 2024-02-19 ENCOUNTER — Encounter (HOSPITAL_COMMUNITY): Payer: Self-pay | Admitting: Internal Medicine

## 2024-02-20 LAB — SURGICAL PATHOLOGY

## 2024-02-21 DIAGNOSIS — E039 Hypothyroidism, unspecified: Secondary | ICD-10-CM | POA: Diagnosis not present

## 2024-02-21 DIAGNOSIS — E538 Deficiency of other specified B group vitamins: Secondary | ICD-10-CM | POA: Diagnosis not present

## 2024-02-21 DIAGNOSIS — E785 Hyperlipidemia, unspecified: Secondary | ICD-10-CM | POA: Diagnosis not present

## 2024-02-21 DIAGNOSIS — D500 Iron deficiency anemia secondary to blood loss (chronic): Secondary | ICD-10-CM | POA: Diagnosis not present

## 2024-02-21 DIAGNOSIS — K219 Gastro-esophageal reflux disease without esophagitis: Secondary | ICD-10-CM | POA: Diagnosis not present

## 2024-02-21 DIAGNOSIS — Z1321 Encounter for screening for nutritional disorder: Secondary | ICD-10-CM | POA: Diagnosis not present

## 2024-02-21 DIAGNOSIS — R739 Hyperglycemia, unspecified: Secondary | ICD-10-CM | POA: Diagnosis not present

## 2024-02-21 DIAGNOSIS — I10 Essential (primary) hypertension: Secondary | ICD-10-CM | POA: Diagnosis not present

## 2024-02-24 ENCOUNTER — Ambulatory Visit: Payer: Self-pay | Admitting: Internal Medicine

## 2024-03-05 DIAGNOSIS — E785 Hyperlipidemia, unspecified: Secondary | ICD-10-CM | POA: Diagnosis not present

## 2024-03-05 DIAGNOSIS — R609 Edema, unspecified: Secondary | ICD-10-CM | POA: Diagnosis not present

## 2024-03-05 DIAGNOSIS — E039 Hypothyroidism, unspecified: Secondary | ICD-10-CM | POA: Diagnosis not present

## 2024-03-05 DIAGNOSIS — I10 Essential (primary) hypertension: Secondary | ICD-10-CM | POA: Diagnosis not present

## 2024-05-13 DIAGNOSIS — H35361 Drusen (degenerative) of macula, right eye: Secondary | ICD-10-CM | POA: Diagnosis not present

## 2024-06-27 DIAGNOSIS — R7303 Prediabetes: Secondary | ICD-10-CM | POA: Diagnosis not present

## 2024-06-27 DIAGNOSIS — E785 Hyperlipidemia, unspecified: Secondary | ICD-10-CM | POA: Diagnosis not present

## 2024-06-27 DIAGNOSIS — D649 Anemia, unspecified: Secondary | ICD-10-CM | POA: Diagnosis not present

## 2024-06-27 DIAGNOSIS — E559 Vitamin D deficiency, unspecified: Secondary | ICD-10-CM | POA: Diagnosis not present

## 2024-06-27 DIAGNOSIS — R739 Hyperglycemia, unspecified: Secondary | ICD-10-CM | POA: Diagnosis not present

## 2024-06-30 DIAGNOSIS — Z0001 Encounter for general adult medical examination with abnormal findings: Secondary | ICD-10-CM | POA: Diagnosis not present

## 2024-06-30 DIAGNOSIS — Z1389 Encounter for screening for other disorder: Secondary | ICD-10-CM | POA: Diagnosis not present

## 2024-06-30 DIAGNOSIS — Z0000 Encounter for general adult medical examination without abnormal findings: Secondary | ICD-10-CM | POA: Diagnosis not present

## 2024-07-01 DIAGNOSIS — N3281 Overactive bladder: Secondary | ICD-10-CM | POA: Diagnosis not present

## 2024-07-01 DIAGNOSIS — Z23 Encounter for immunization: Secondary | ICD-10-CM | POA: Diagnosis not present

## 2024-07-01 DIAGNOSIS — E559 Vitamin D deficiency, unspecified: Secondary | ICD-10-CM | POA: Diagnosis not present

## 2024-07-01 DIAGNOSIS — E039 Hypothyroidism, unspecified: Secondary | ICD-10-CM | POA: Diagnosis not present

## 2024-07-01 DIAGNOSIS — M170 Bilateral primary osteoarthritis of knee: Secondary | ICD-10-CM | POA: Diagnosis not present

## 2024-07-01 DIAGNOSIS — R609 Edema, unspecified: Secondary | ICD-10-CM | POA: Diagnosis not present

## 2024-07-01 DIAGNOSIS — E785 Hyperlipidemia, unspecified: Secondary | ICD-10-CM | POA: Diagnosis not present

## 2024-07-01 DIAGNOSIS — I10 Essential (primary) hypertension: Secondary | ICD-10-CM | POA: Diagnosis not present

## 2024-07-01 DIAGNOSIS — K219 Gastro-esophageal reflux disease without esophagitis: Secondary | ICD-10-CM | POA: Diagnosis not present

## 2024-07-01 DIAGNOSIS — D649 Anemia, unspecified: Secondary | ICD-10-CM | POA: Diagnosis not present

## 2024-07-10 DIAGNOSIS — H5213 Myopia, bilateral: Secondary | ICD-10-CM | POA: Diagnosis not present

## 2024-08-05 ENCOUNTER — Encounter: Payer: Self-pay | Admitting: Internal Medicine

## 2024-09-11 ENCOUNTER — Encounter: Payer: Self-pay | Admitting: Gastroenterology
